# Patient Record
Sex: Female | Born: 1955 | Race: White | Hispanic: No | Marital: Married | Smoking: Never smoker
Health system: Southern US, Community
[De-identification: ages and names within clinical notes are randomized; demographics above are authoritative.]

## PROBLEM LIST (undated history)

## (undated) DIAGNOSIS — N946 Dysmenorrhea, unspecified: Secondary | ICD-10-CM

## (undated) DIAGNOSIS — N83201 Unspecified ovarian cyst, right side: Secondary | ICD-10-CM

## (undated) DIAGNOSIS — Z8742 Personal history of other diseases of the female genital tract: Secondary | ICD-10-CM

## (undated) DIAGNOSIS — N83202 Unspecified ovarian cyst, left side: Secondary | ICD-10-CM

## (undated) HISTORY — DX: Unspecified ovarian cyst, right side: N83.201

## (undated) HISTORY — PX: TONSILLECTOMY: SHX5217

## (undated) HISTORY — DX: Unspecified ovarian cyst, left side: N83.202

## (undated) HISTORY — PX: DILATION AND CURETTAGE OF UTERUS: SHX78

## (undated) HISTORY — DX: Personal history of other diseases of the female genital tract: Z87.42

## (undated) HISTORY — DX: Dysmenorrhea, unspecified: N94.6

## (undated) HISTORY — PX: WISDOM TOOTH EXTRACTION: SHX21

---

## 2000-09-09 ENCOUNTER — Ambulatory Visit (HOSPITAL_COMMUNITY): Admission: RE | Admit: 2000-09-09 | Discharge: 2000-09-09 | Payer: Self-pay | Admitting: Obstetrics and Gynecology

## 2000-09-09 ENCOUNTER — Encounter: Payer: Self-pay | Admitting: Obstetrics and Gynecology

## 2001-07-28 ENCOUNTER — Ambulatory Visit (HOSPITAL_COMMUNITY): Admission: RE | Admit: 2001-07-28 | Discharge: 2001-07-28 | Payer: Self-pay | Admitting: Obstetrics and Gynecology

## 2001-07-28 ENCOUNTER — Encounter (INDEPENDENT_AMBULATORY_CARE_PROVIDER_SITE_OTHER): Payer: Self-pay | Admitting: *Deleted

## 2001-07-28 ENCOUNTER — Encounter: Payer: Self-pay | Admitting: Obstetrics and Gynecology

## 2001-07-28 ENCOUNTER — Encounter (INDEPENDENT_AMBULATORY_CARE_PROVIDER_SITE_OTHER): Payer: Self-pay

## 2002-06-10 ENCOUNTER — Other Ambulatory Visit: Admission: RE | Admit: 2002-06-10 | Discharge: 2002-06-10 | Payer: Self-pay | Admitting: Obstetrics and Gynecology

## 2003-06-22 ENCOUNTER — Other Ambulatory Visit: Admission: RE | Admit: 2003-06-22 | Discharge: 2003-06-22 | Payer: Self-pay | Admitting: Obstetrics and Gynecology

## 2009-10-19 ENCOUNTER — Encounter: Admission: RE | Admit: 2009-10-19 | Discharge: 2009-10-19 | Payer: Self-pay | Admitting: Family Medicine

## 2010-07-06 NOTE — Op Note (Signed)
[facility]  Patient:    Maria Sanford, Maria Sanford Visit Number: [id number] MRN: [id number]          Service Type: DSU Location: [facility] Attending Physician:  Shaune Spittle Dictated by:   Maris Berger. Pennie Rushing, M.D. Proc. Date: 07/28/01 Admit Date:  07/28/2001 Discharge Date: 07/28/2001                             Operative Report  DATE OF BIRTH:                Aug 09, 1955  PREOPERATIVE DIAGNOSES:       1. Menorrhagia.                               2. Right ovarian solid lesion on ultrasound                                  persistent over one year.  POSTOPERATIVE DIAGNOSES:      1. Menorrhagia.                               2. Right ovarian complex cystic and solid                                  lesion.  OPERATION:                    1. Suction curettage.                               2. Cryoablation of the endometrium.                               3. Operative laparoscopy.                               4. Right ovarian lesion removal  SURGEON:                      Vanessa P. Pennie Rushing, M.D.  FIRST ASSISTANT:              Henreitta Leber, P.A.-C.  ANESTHESIA:                   General orotracheal.  ESTIMATED BLOOD LOSS:         Less than 50 cc.  COMPLICATIONS:                None.  FINDINGS:                     The uterus was normal size and sounded to 7 cm. Minimal tissue was obtained at the time of suction curettage prior to cryoablation.  The right ovary contained a 2 x 3 cm mass which was both cystic and solid in nature.  The left ovary contained an apparent corpus luteum cyst. There were in excrescences, and no other peritoneal lesions were noted.  DESCRIPTION OF PROCEDURE:     The patient was taken to  the operating room after appropriate identification and placed on the operating table.  She was placed in the supine position.  After the attainment of adequate general anesthesia, she was placed in the modified lithotomy  position.  The perineum and vagina were prepped with multiple layers of Betadine.  A Foley catheter was inserted into the bladder but was clamped for the cryoablation procedure. A single-tooth tenaculum was placed on the anterior cervix.  The uterus was sounded to 7 cm, and a 7 mm curet was used to suction curet the uterus after dilating the cervix to accommodate this curet.  Minimal tissue was obtained, and her option cryoprobe which had been prepared was used first to access the right cornual region for a 6-minute freeze.  Following the directions of her option software, the probe was removed and then directed again under ultrasound to the left cornual region for a 6-minute freeze.  The entire procedure was done under ultrasound guidance with formation of an excellent ice ball documented on ultrasound.  Once the 6-minute freeze on the left side was completed, the cryoprobe was removed once adequate thawing had occurred and the Foley catheter allowed to drain freely.  The abdomen was then prepped with multiple layers of Betadine and draped in a sterile field.  A subumbilical injection and suprapubic injection of 0.25% Marcaine was undertaken to anesthetize the areas anticipated for subsequent incision.  A subumbilical incision was then made and Veress cannula placed through that incision into the peritoneal cavity.   A pneumoperitoneum was created with 2.5 liters of CO2 and the Veress cannula removed.  The laparoscopic trocar was placed through the subumbilical incision and the laparoscope placed through the trocar sleeve.  Suprapubic incisions were made to the right and left of midline at the previously injected sites and laparoscopic probe trocars placed through those incisions into the peritoneal cavity under direct visualization.  The above-noted findings were made.  The right ovary was then grasped at the utero-ovarian ligament and elevated.  The cortex was incised, and a combination  of hydrodissection and sharp dissection allowed the cystic and solid lesion to be dissected off the surrounding cortex.  The lesion was then excised and placed in an Endocatch bag for removal through the subumbilical incision.  It was required that the subumbilical incision be widened with a Tresa Endo in order to remove the Endocatch bag, and a 5 mm scope was used to assist with the retrieval.  Once the Endocatch bag had been removed, the right ovarian cortex was visualized and the edges cauterized with Kleppinger forceps to allow adequate hemostasis of the right ovarian cortex.  Copious irrigation with lactated Ringers was carried out, and approximately 100 cc of warm lactated Ringers was left in the pelvis.  Hemostasis was noted to be adequate, and all instruments were removed from the peritoneal cavity under direct visualization as the CO2 was allowed to escape.  The subumbilical incision was closed first with fascial sutures of 0 Vicryl in an interrupted fashion and then with a subcuticular suture of 4-0 Vicryl; 4-0 Vicryl was used to close the suprapubic incisions in a subcuticular fashion and sterile dressings applied.  The single-tooth tenaculum and Foley catheter were removed from the cervix and bladder, respectively.  The patient was awakened from general anesthesia and taken to the recovery room in satisfactory condition having tolerated the procedure well with sponge and instrument counts correct. Dictated by:   Maris Berger. Pennie Rushing, M.D. Attending Physician:  Shaune Spittle DD:  07/28/01  TD:  07/30/01 Job: [id number]

## 2011-04-26 ENCOUNTER — Encounter: Payer: Self-pay | Admitting: Obstetrics and Gynecology

## 2011-05-27 ENCOUNTER — Ambulatory Visit: Payer: Self-pay | Admitting: Obstetrics and Gynecology

## 2011-07-22 ENCOUNTER — Telehealth: Payer: Self-pay

## 2011-07-22 DIAGNOSIS — R001 Bradycardia, unspecified: Secondary | ICD-10-CM | POA: Insufficient documentation

## 2011-07-22 DIAGNOSIS — N920 Excessive and frequent menstruation with regular cycle: Secondary | ICD-10-CM | POA: Insufficient documentation

## 2011-07-22 DIAGNOSIS — N838 Other noninflammatory disorders of ovary, fallopian tube and broad ligament: Secondary | ICD-10-CM

## 2011-07-22 DIAGNOSIS — D649 Anemia, unspecified: Secondary | ICD-10-CM

## 2011-07-22 DIAGNOSIS — O43199 Other malformation of placenta, unspecified trimester: Secondary | ICD-10-CM

## 2011-07-22 DIAGNOSIS — Q999 Chromosomal abnormality, unspecified: Secondary | ICD-10-CM | POA: Insufficient documentation

## 2011-07-22 DIAGNOSIS — N915 Oligomenorrhea, unspecified: Secondary | ICD-10-CM | POA: Insufficient documentation

## 2011-07-25 ENCOUNTER — Ambulatory Visit
Admission: RE | Admit: 2011-07-25 | Discharge: 2011-07-25 | Disposition: A | Discharge status: Home / Self Care | Payer: Managed Care, Other (non HMO) | Source: Ambulatory Visit | Attending: Obstetrics and Gynecology | Admitting: Obstetrics and Gynecology

## 2011-07-25 ENCOUNTER — Encounter: Payer: Self-pay | Admitting: Obstetrics and Gynecology

## 2011-07-25 ENCOUNTER — Ambulatory Visit (INDEPENDENT_AMBULATORY_CARE_PROVIDER_SITE_OTHER): Payer: Managed Care, Other (non HMO) | Admitting: Obstetrics and Gynecology

## 2011-07-25 VITALS — BP 118/80 | Resp 18 | Ht 60.0 in | Wt 117.0 lb

## 2011-07-25 DIAGNOSIS — R6889 Other general symptoms and signs: Secondary | ICD-10-CM

## 2011-07-25 DIAGNOSIS — Z01419 Encounter for gynecological examination (general) (routine) without abnormal findings: Secondary | ICD-10-CM

## 2011-07-25 DIAGNOSIS — Z124 Encounter for screening for malignant neoplasm of cervix: Secondary | ICD-10-CM

## 2011-07-25 DIAGNOSIS — E041 Nontoxic single thyroid nodule: Secondary | ICD-10-CM

## 2011-07-25 DIAGNOSIS — T679XXA Effect of heat and light, unspecified, initial encounter: Secondary | ICD-10-CM

## 2011-07-25 DIAGNOSIS — Z139 Encounter for screening, unspecified: Secondary | ICD-10-CM

## 2011-07-25 LAB — CBC
MCH: 31.6 pg (ref 26.0–34.0)
MCHC: 33.8 g/dL (ref 30.0–36.0)
MCV: 93.3 fL (ref 78.0–100.0)
Platelets: 264 10*3/uL (ref 150–400)
RDW: 13.6 % (ref 11.5–15.5)

## 2011-07-25 LAB — TSH: TSH: 2.721 u[IU]/mL (ref 0.350–4.500)

## 2011-07-25 NOTE — Progress Notes (Signed)
Contraception PM Last pap 2012 Last Mammo 03/2010 Last Colonoscopy 2011 Last Dexa Scan never Primary MD [facility]/ [facility] Abuse at Home none  No complaints  Filed Vitals:   07/25/11 0958  BP: 118/80  Resp: 18    ROS: noncontributory  Physical Examination: General appearance - alert, well appearing, and in no distress Neck - supple, no significant adenopathy, thyroid nodular on right Chest - clear to auscultation, no wheezes, rales or rhonchi, symmetric air entry Heart - normal rate and regular rhythm Abdomen - soft, nontender, nondistended, no masses or organomegaly Breasts - breasts appear normal, no suspicious masses, no skin or nipple changes or axillary nodes Pelvic - normal external genitalia, vulva, vagina, cervix, uterus and adnexa, atrophic vagina Back exam - no CVAT Extremities - no edema, redness or tenderness in the calves or thighs  A/P Pap Labs Thyroid u/s

## 2011-07-29 LAB — PAP IG W/ RFLX HPV ASCU

## 2011-07-31 ENCOUNTER — Telehealth: Payer: Self-pay | Admitting: Obstetrics and Gynecology

## 2011-07-31 ENCOUNTER — Other Ambulatory Visit: Payer: Self-pay | Admitting: Obstetrics and Gynecology

## 2011-07-31 DIAGNOSIS — E041 Nontoxic single thyroid nodule: Secondary | ICD-10-CM

## 2011-07-31 NOTE — Telephone Encounter (Signed)
Message copied by Mason Jim on Wed Jul 31, 2011  2:26 PM ------      Message from: Osborn Coho      Created: Thu Jul 25, 2011 10:28 PM       Please inform pt and refer to ENT for Biopsy of thyroid nodule and send report.  Please refer to Dr. Talmage Nap to follow as well.  Labs pending.  Thanks

## 2011-07-31 NOTE — Telephone Encounter (Signed)
Pt called about u/s results. Dr Su Hilt has a note on the u/s report for referral.

## 2011-07-31 NOTE — Telephone Encounter (Signed)
Per Dr Alinda Sierras, pt scheduled with Dr Pollyann Kennedy at [facility] 08/06/11 at 9:30.  To arrive 15 min early with list of meds and copay. Referral faxed to Dr Talmage Nap.   TC to pt. LM to return call.

## 2011-07-31 NOTE — Telephone Encounter (Signed)
JACKIE/ar PT

## 2011-07-31 NOTE — Telephone Encounter (Signed)
TC to pt.  Informed of appt with DR Pollyann Kennedy.  To calll if does not hear from DR Balan"s office in 1 week.  Pt verbalizes comprehension.

## 2011-08-01 ENCOUNTER — Telehealth: Payer: Self-pay

## 2011-08-01 NOTE — Telephone Encounter (Signed)
Called pt to answer her ? About when thyroid nodule biopy will be done. Per Nurse at Dr. Lucky Rathke office, usually bx can be done on the initial appt. Date. Pt states she has appt w/ Dr. Talmage Nap on Monday the 17th June and then sees Dr Pollyann Kennedy the 18th , the next day.  Melody Comas A

## 2011-08-06 ENCOUNTER — Other Ambulatory Visit: Payer: Self-pay | Admitting: Endocrinology

## 2011-08-06 DIAGNOSIS — E041 Nontoxic single thyroid nodule: Secondary | ICD-10-CM

## 2011-08-07 ENCOUNTER — Telehealth: Payer: Self-pay | Admitting: Obstetrics and Gynecology

## 2011-08-07 NOTE — Telephone Encounter (Signed)
TC to pt.   States was seen by Dr Talmage Nap 08/05/11.  States Dr Talmage Nap will do further F/U .  Cancelled appt with Dr Pollyann Kennedy.

## 2011-08-20 ENCOUNTER — Ambulatory Visit
Admission: RE | Admit: 2011-08-20 | Discharge: 2011-08-20 | Disposition: A | Discharge status: Home / Self Care | Payer: Managed Care, Other (non HMO) | Source: Ambulatory Visit | Attending: Endocrinology | Admitting: Endocrinology

## 2011-08-20 ENCOUNTER — Other Ambulatory Visit (HOSPITAL_COMMUNITY)
Admission: RE | Admit: 2011-08-20 | Discharge: 2011-08-20 | Disposition: A | Discharge status: Home / Self Care | Payer: Managed Care, Other (non HMO) | Source: Ambulatory Visit | Attending: Interventional Radiology | Admitting: Interventional Radiology

## 2011-08-20 DIAGNOSIS — E041 Nontoxic single thyroid nodule: Secondary | ICD-10-CM

## 2012-11-24 ENCOUNTER — Other Ambulatory Visit: Payer: Self-pay | Admitting: Endocrinology

## 2012-11-24 DIAGNOSIS — E041 Nontoxic single thyroid nodule: Secondary | ICD-10-CM

## 2012-12-03 ENCOUNTER — Other Ambulatory Visit: Payer: Managed Care, Other (non HMO)

## 2013-03-17 ENCOUNTER — Ambulatory Visit (INDEPENDENT_AMBULATORY_CARE_PROVIDER_SITE_OTHER): Payer: 59 | Admitting: Psychology

## 2013-03-17 DIAGNOSIS — F908 Attention-deficit hyperactivity disorder, other type: Secondary | ICD-10-CM

## 2013-03-17 DIAGNOSIS — F909 Attention-deficit hyperactivity disorder, unspecified type: Secondary | ICD-10-CM

## 2013-03-17 DIAGNOSIS — F3110 Bipolar disorder, current episode manic without psychotic features, unspecified: Secondary | ICD-10-CM

## 2013-03-26 ENCOUNTER — Other Ambulatory Visit: Payer: Managed Care, Other (non HMO)

## 2013-03-26 ENCOUNTER — Ambulatory Visit (HOSPITAL_COMMUNITY): Payer: Managed Care, Other (non HMO) | Admitting: Psychology

## 2013-03-31 ENCOUNTER — Ambulatory Visit
Admission: RE | Admit: 2013-03-31 | Discharge: 2013-03-31 | Disposition: A | Discharge status: Home / Self Care | Payer: 59 | Source: Ambulatory Visit | Attending: Endocrinology | Admitting: Endocrinology

## 2013-03-31 DIAGNOSIS — E041 Nontoxic single thyroid nodule: Secondary | ICD-10-CM

## 2013-04-05 ENCOUNTER — Other Ambulatory Visit: Payer: Self-pay | Admitting: Endocrinology

## 2013-04-05 DIAGNOSIS — E041 Nontoxic single thyroid nodule: Secondary | ICD-10-CM

## 2013-04-16 ENCOUNTER — Ambulatory Visit (HOSPITAL_COMMUNITY): Payer: Managed Care, Other (non HMO) | Admitting: Psychology

## 2013-05-10 ENCOUNTER — Ambulatory Visit (HOSPITAL_COMMUNITY): Payer: Managed Care, Other (non HMO) | Admitting: Psychology

## 2013-05-14 NOTE — Progress Notes (Signed)
Patient:   Maria Sanford   DOB:   [birth year]  MR Number:  [id number]  Location:  [facility] [address] [place] [id number] Dept: [phone]           Date of Service:   03/17/2013  Start Time:   11 AM End Time:   12 PM  Provider/Observer:  Hershal CoriaJohn R Rodenbough PSYD       Billing Code/Service: [phone]  Chief Complaint:     Chief Complaint  Patient presents with  . ADD    Reason for Service:  The patient was self referred because of significant concerns about her lack of focus and difficulties with attention and concentration. The patient reports that she has had great difficulty completing tasks and staying on track. The patient reports that her father had attention deficit disorder and she has a question about the possibility of whether she has a dull residual attention deficit disorder as well. There is a positive family history of mental illness. Her mother has been diagnosed with a: Disorder and her father was diagnosed with ADD and is always been very hyper. The patient has been treated for mania and but gets depressed on rare occasions. The patient also has times of poor sleep. The patient has also been treated for thyroid disorder as well.  Current Status:  The patient reports a lifelong history of problems with hyperactivity and attentional problems but does acknowledge some episodes of hypomanic or manic behavior and some rare occasions of depression.  Reliability of Information: Information is provided by the patient as well as review of available medical records.  Behavioral Observation: Maria Sanford  presents as a 58 y.o.-year-old Right Caucasian Female who appeared her stated age. her dress was Appropriate and she was Well Groomed and her manners were Appropriate to the situation.  There were not any physical disabilities noted.  she displayed an appropriate level of  cooperation and motivation.    Interactions:    Active   Attention:   While there were no overt problems identified with attention and concentration during the clinical interview the patient did acknowledge that this is been a major issue for her.  Memory:   The patient reports problems with memory but not to a major degree. This likely is more to do with attention/concentration and focus difficulties.  Visuo-spatial:   within normal limits  Speech (Volume):  normal  Speech:   normal pitch  Thought Process:  Coherent  Though Content:  WNL  Orientation:   person, place, time/date and situation  Judgment:   Good  Planning:   Good  Affect:    The patient's affect was appropriate to the situation no overt symptoms of anxiety or depression.  Mood:    no overt symptoms of anxiety or depression.  Insight:   Good  Intelligence:   high  Marital Status/Living: The patient was [place]. Both her parents are deceased. The patient is married and has a 58 year old son and a 58 year old daughter. She currently lives with her husband. The patient is a 58 year old sister and a 58 year old brother. The patient spends his leisure time gardening, reading, and riding motorcycles.   Substance Use:  No concerns of substance abuse are reported.      Medical History:   Past Medical History  Diagnosis Date  . Hx of menorrhagia   . Ovarian cyst, bilateral   .  Dysmenorrhea         Outpatient Encounter Prescriptions as of 03/17/2013  Medication Sig  . divalproex (DEPAKOTE) 125 MG DR tablet Take 125 mg by mouth 3 (three) times daily.  Marland Kitchen lamoTRIgine (LAMICTAL) 150 MG tablet Take 150 mg by mouth daily.          Sexual History:   History  Sexual Activity  . Sexual Activity: Not on file    Abuse/Trauma History: The patient does not report any history of abuse/trauma.  Psychiatric History:  The patient does report that she has been treated for  bipolar disorder like symptoms and has been taking Depakote and Lamictal.  Family Med/Psych History: No family history on file.  Risk of Suicide/Violence: The patient denies any suicidal or homicidal ideation.   Impression/DX:  the patient reports that her mother has a significant bipolar affective disorder in her father is long displayed issues of hypokinesis problems. The patient has been treated for manic episodes with Depakote and Lamictal. The current differential has to do with bipolar manic type, chronic sleep deprivation versus a dull residual attention deficit disorder.  Disposition/Plan:  We will set up patient for formal testing utilizing the comprehensive attention battery and the cab cpt measure.  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

## 2013-05-17 ENCOUNTER — Ambulatory Visit (INDEPENDENT_AMBULATORY_CARE_PROVIDER_SITE_OTHER): Payer: 59 | Admitting: Psychology

## 2013-05-17 DIAGNOSIS — F908 Attention-deficit hyperactivity disorder, other type: Secondary | ICD-10-CM

## 2013-05-17 DIAGNOSIS — F909 Attention-deficit hyperactivity disorder, unspecified type: Secondary | ICD-10-CM

## 2013-05-17 DIAGNOSIS — F3110 Bipolar disorder, current episode manic without psychotic features, unspecified: Secondary | ICD-10-CM

## 2013-05-19 ENCOUNTER — Encounter (HOSPITAL_COMMUNITY): Payer: Self-pay | Admitting: Psychology

## 2013-05-19 NOTE — Progress Notes (Signed)
The patient was administered the Comprehensive Attention Battery and the CAB CPT measures. The patient appeared to fully participate in these testing procedures and this does appear to be a fair and valid sample of her current attentional abilities as well as various aspects of executive functioning. Below are the results of this broad and comprehensive assessment of attention/concentration and executive functioning.  Initially, the patient was administered the auditory/visual reaction time test. These two measures are both pure reaction time measures and are administered in both the visual and auditory modalities. On the visual pure reaction time test, the patient accurately responded to 50 of the 50 targets, which is within normal limits. her average response time was 313 ms which is also within normal limits. The patient was administered the auditory pure reaction time test and she correctly responded to 50 of 50 targets, which is an efficient performance and within normal limits. her average response time was 326 ms, which also within normal limits.  The patient was then administered the discriminant reaction time test. she was administered the visual, auditory, and mixed subtests. On the visual discriminate reaction time measure, she correctly responded to 34 of 35 targets and had 0 errors of commission and 1 errors of omission. This is an efficient performance and represents a performance that is well within normative expectations. her average response time for correctly responded to items was 356 ms which is also within normal limits. The patient was then administered the auditory discriminate reaction time measure. she correctly responded to 34 of 35 targets, which is efficient and within normal limits. her average response time was 608 ms, which is within normative expectations. The patient was then administered the mixed discriminate reaction time, which require shifting from between either auditory  or visual targets with an alteration between auditory and visual stimuli. This measure require shifting attention on top of discriminate identification and responding.  The patient correctly responded to 24 of the 30 targets and had to errors of commission and 6 errors of omission. This is a more impaired score for accuracy nearly one standard deviation above normative levels.  her average response time for correct responses was 698 ms.  This performance is within  normal limits and represents good response times for correctly identified items. However, the patient clearly had difficulty shifting between targets and had evidence of lapses of attention during this measure. Lapses of attention and cognitive shifting were noted.  The patient was administered the auditory/visual scan reaction time test. On the visual measure the patient correctly responded to 35 of 40 targets. She had 0 errors of commission and 5 errors of omission and the average response time was within normal limits. The auditory measure resulted in the correct response to 35 of 40 targets with 1 errors of commission and 5 error of omission. her average response times within normal limits. The patient was then administered the mixed auditory visual scan measure and she correctly responded to 32 of 40 targets, which is significantly impaired and more than one half standard deviations below normative expectations. Her average response times were within normal limits.  The patient was then administered the auditory/visual encoding test. On the auditory forwards the patient's performance was within normal limits.  On the auditory backwards measures the patient's performance was within normal limits.  This pattern suggests good performance with regard to auditory encoding. On the visual encoding forward measure the patient produced performance that was within normal limits.  On the visual backwards measures the  patient's performance was within normal  limits.  Overall, this pattern suggests that auditory encoding is efficient and within normal limits which was also the finding for her visual encoding abilities.  The patient was then administered the Stroop interference cancellation test. This task is broken down into eight separate trials. On the first four trials the patient is presented with a focus execute task that requires the patient to scan a 36 grid layout in which the words red green or blue were randomly printed in each grid. Each of these color words and be printed in either red green or blue color. On half of them, the word matches the color of the font and it is these that the patient is to identify where the color and word match. After the first four trials of this visual scanning measure change to four trials that include a Stroop interference component inwhich the words red green and blue are played randomly over the speakers. On the first four "noninterference" trials the patient produced performances on these focus execute task that were moderately impaired and between 1-1/2 standard deviations below normal limits. she correctly identified between 7 and 10 items on each of these trials. On the next four interference trials, the patient's performance showed significant deterioration especially with the first interference trial. She only got 3 of the 18th target which is 1-1/2 standard deviations below normative expectations. Her performance did not improve significantly she continued to show deficits with regard to visual scanning and processing of this more complex information. Because earlier testing show that her information processing is generally well within normal limits it is likely that these visual distractors and auditory distractors were the primary factor for her weakness in this area.  The patient was then administered the CAB CPT visual monitor measure, which is a 15 minute long visual continuous performance measure.  This  measure is broken down into five 3-minute blocks of time for analysis. The patient is presented with either the color red green or blue every 2 seconds and every time the color red is presented the patient is to respond. On the first 3 min. Block of time the patient correctly identified 28 of 30 targets with 1 error of commission and 2 errors of omission. her average response time was 452 ms. This performance showed significant deterioration over the next four blocks of time.  Average response time showed a great deal of variability with significant increases in response time is a function of time. She clearly deteriorated as far as her response time by more than 150-200 ms slowing or active responded to items. A number of errors of omission during each of two 3 minute blocks of time be. However, between the 6-15 minute mark she had a total of 22 errors of omission. She showed a marked deterioration as a function of time with significant increases in errors of omission. In fact, she was missing target items as much is 30% of the time or to the end of the measure.  This pattern of performance on a continuous performance test was significantly impaired and clearly indicative of severe and significant problems with sustained attention and concentration. This is in marked contrast to an excellent ability to do these discriminate reaction time measures for brief periods of time of up 2 or 3 minutes.  Overall:  The patient's performance on this broad range of attention/concentration measures and executive functioning measures are clearly indicative of significant deficits with regard to attention and concentration. These deficits  go well beyond those typically seen with patterns associated with bipolar affective disorder as the primary etiological factor. The patient showed a number of areas of significant impairment. One has to do with significant problems in shifting attention between different stimuli. She was  easily distracted and had difficulty remembering what target she was instructed to attend to. The patient showed difficulty with even simple switching between visual and auditory stimuli and produced a significant number of errors of omission. On targeted interference measures, the patient showed difficulty to both sustain attention as she did better in the initial noninterference trials the deteriorated over these noninterference trials. We typically see in improvement with each of the 4 noninterference trials over time.   She also showed a marked impairment when there was a target interference presented to her. She was overly distracted  and confused by this interference. Also, the patient showed significant  problems with sustained attention. She showed a marked deterioration of attention and concentration over time with significant increases in errors of omission as well as significant reduction in response times to items that she correctly identified.   The patient showed excellent auditory and visual encoding abilities,  suggesting a good learning and ability  as these items were not done in any sustained manner and there was no time limit or distractor present.  Overall, the patient shows marked deficits with regard to shifting attention, sustained attention, and distractibility. This pattern as well beyond those typically seen with bipolar disorder and it does appear that she clearly show symptoms consistent with  adult residual attention deficit disorder. In fact, some of her hyperkinesis may be related to this condition rather than hypomanic or manic episodes. I will provided feedback regarding the results of the current neuropsychological testing to the patient.

## 2013-06-09 ENCOUNTER — Telehealth (HOSPITAL_COMMUNITY): Payer: Self-pay | Admitting: *Deleted

## 2013-06-10 ENCOUNTER — Ambulatory Visit (HOSPITAL_COMMUNITY): Payer: Self-pay | Admitting: Psychology

## 2013-06-24 ENCOUNTER — Ambulatory Visit (INDEPENDENT_AMBULATORY_CARE_PROVIDER_SITE_OTHER): Payer: 59 | Admitting: Psychology

## 2013-06-24 DIAGNOSIS — F909 Attention-deficit hyperactivity disorder, unspecified type: Secondary | ICD-10-CM

## 2013-06-24 DIAGNOSIS — F908 Attention-deficit hyperactivity disorder, other type: Secondary | ICD-10-CM

## 2013-06-24 NOTE — Progress Notes (Signed)
   PROGRESS NOTE  Provided feedback of neuropsych testing.  Adult residual attention deficit disorder.  Overall:  The patient's performance on this broad range of attention/concentration measures and executive functioning measures are clearly indicative of significant deficits with regard to attention and concentration. These deficits go well beyond those typically seen with patterns associated with bipolar affective disorder as the primary etiological factor. The patient showed a number of areas of significant impairment. One has to do with significant problems in shifting attention between different stimuli. She was easily distracted and had difficulty remembering what target she was instructed to attend to. The patient showed difficulty with even simple switching between visual and auditory stimuli and produced a significant number of errors of omission. On targeted interference measures, the patient showed difficulty to both sustain attention as she did better in the initial noninterference trials the deteriorated over these noninterference trials. We typically see in improvement with each of the 4 noninterference trials over time. She also showed a marked impairment when there was a target interference presented to her. She was overly distracted and confused by this interference. Also, the patient showed significant problems with sustained attention. She showed a marked deterioration of attention and concentration over time with significant increases in errors of omission as well as significant reduction in response times to items that she correctly identified. The patient showed excellent auditory and visual encoding abilities, suggesting a good learning and ability as these items were not done in any sustained manner and there was no time limit or distractor present.  Overall, the patient shows marked deficits with regard to shifting attention, sustained attention, and distractibility. This pattern as  well beyond those typically seen with bipolar disorder and it does appear that she clearly show symptoms consistent with adult residual attention deficit disorder. In fact, some of her hyperkinesis may be related to this condition rather than hypomanic or manic episodes. I will provided feedback regarding the results of the current neuropsychological testing to the patient.       Patient to go back to PCP with this info for his consideration of medication options.     Hershal CoriaODENBOUGH,JOHN R, PsyD 06/24/2013

## 2013-07-06 ENCOUNTER — Ambulatory Visit (HOSPITAL_COMMUNITY): Payer: Self-pay | Admitting: Psychology

## 2013-12-02 IMAGING — US US SOFT TISSUE HEAD/NECK
1 series · 14 of 25 positions shown · non-contrast
Comparison: None.

CLINICAL DATA: Right thyroid nodule on physical exam

THYROID ULTRASOUND
TECHNIQUE: Ultrasound examination of the thyroid gland and adjacent
soft tissues was performed.

[Series 1: us soft tissue head/neck · 0.08mm/px · 14 of 55 slices shown]
[im 1/55]
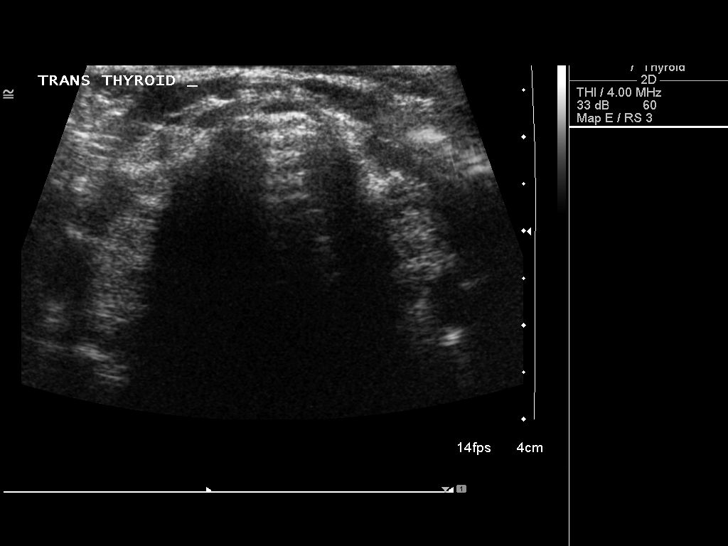
[im 5/55]
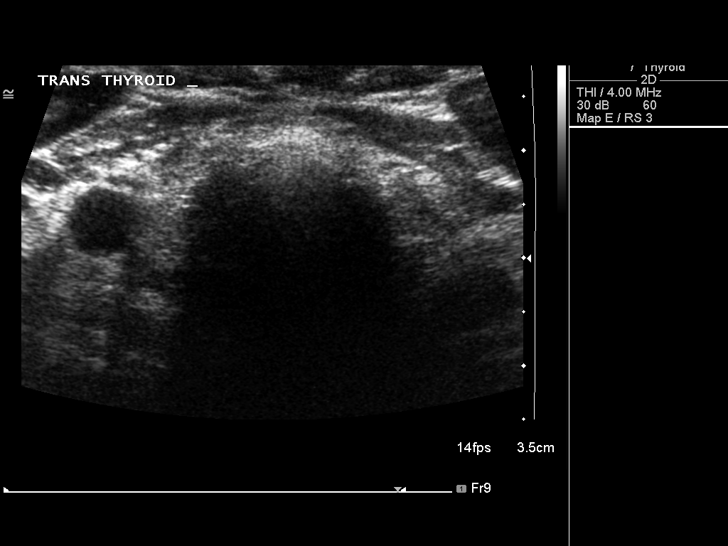
[im 10/55]
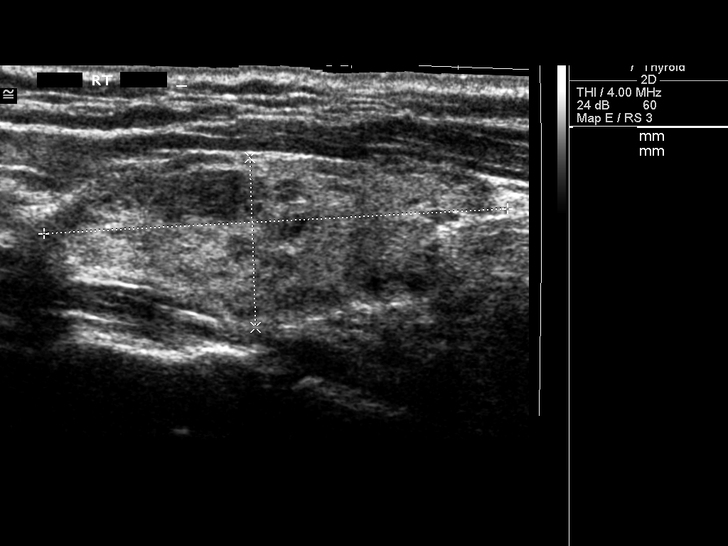
[im 14/55]
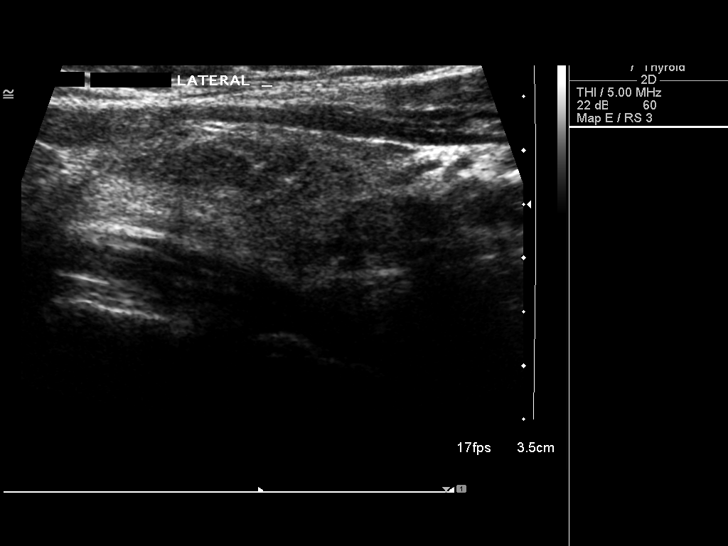
[im 19/55]
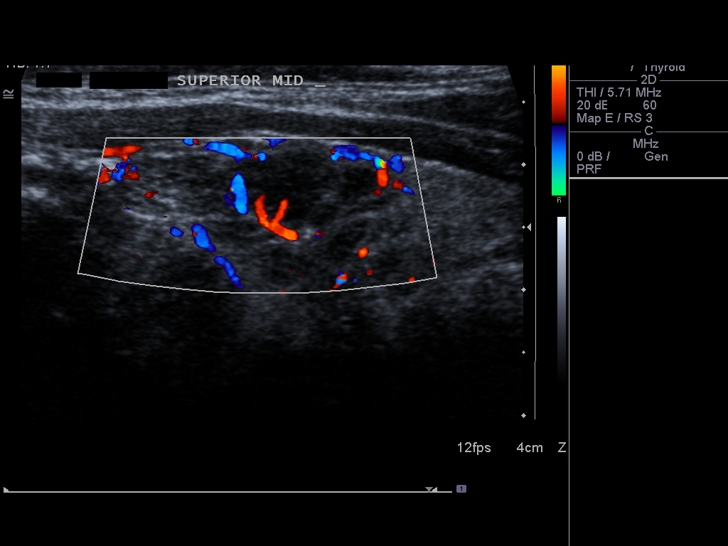
[im 21/55]
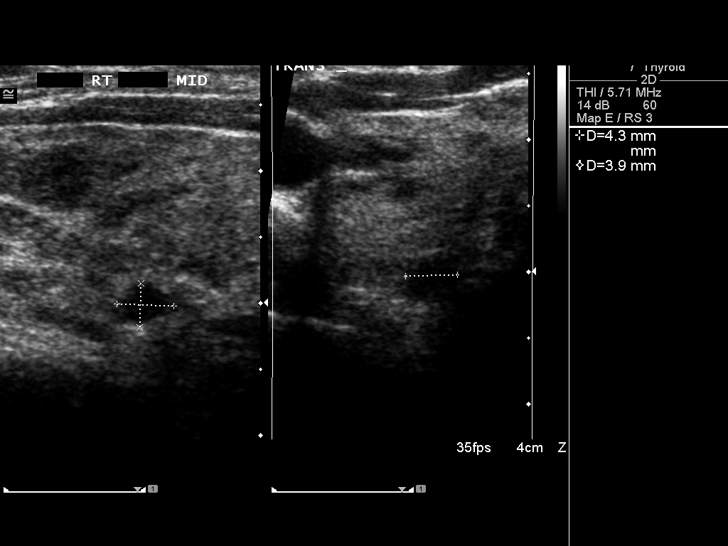
[im 25/55]
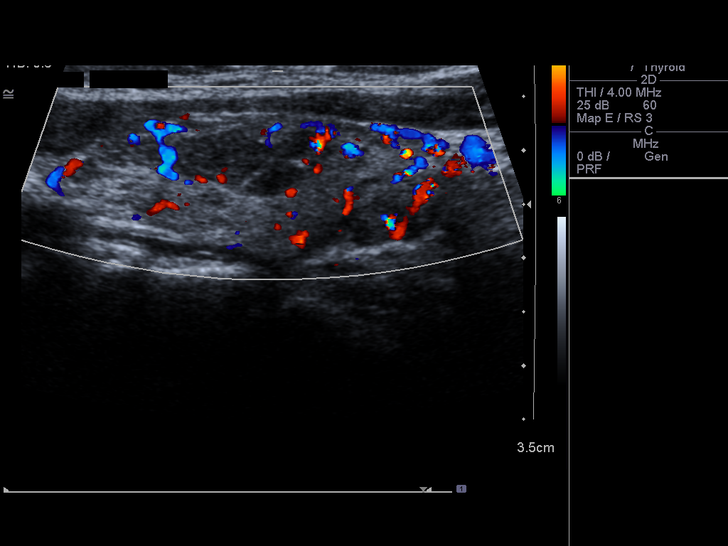
[im 30/55]
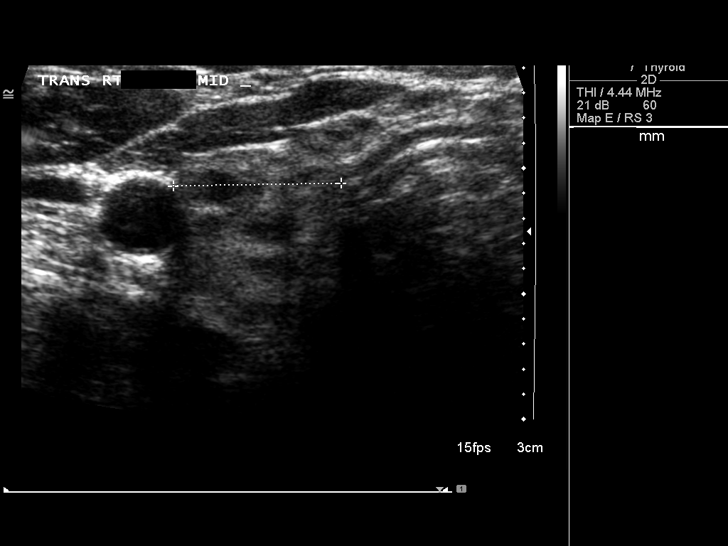
[im 34/55]
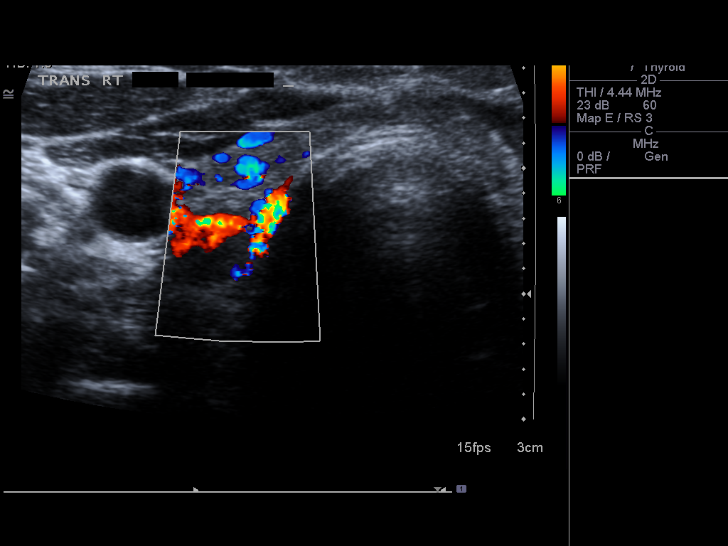
[im 37/55]
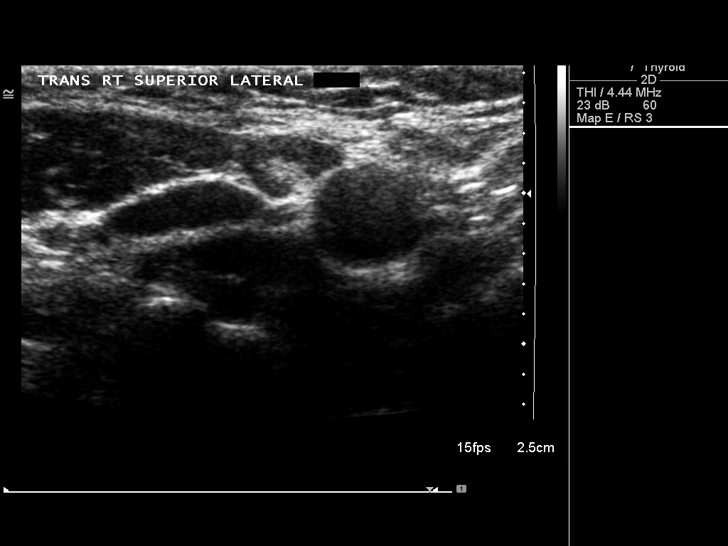
[im 41/55]
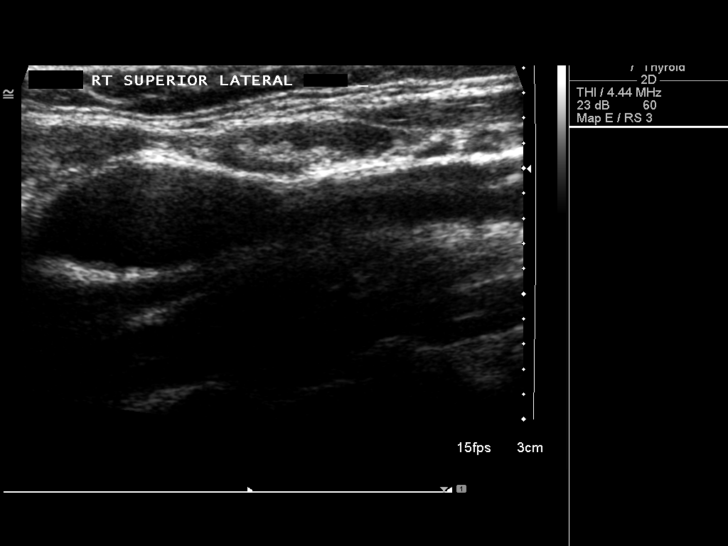
[im 46/55]
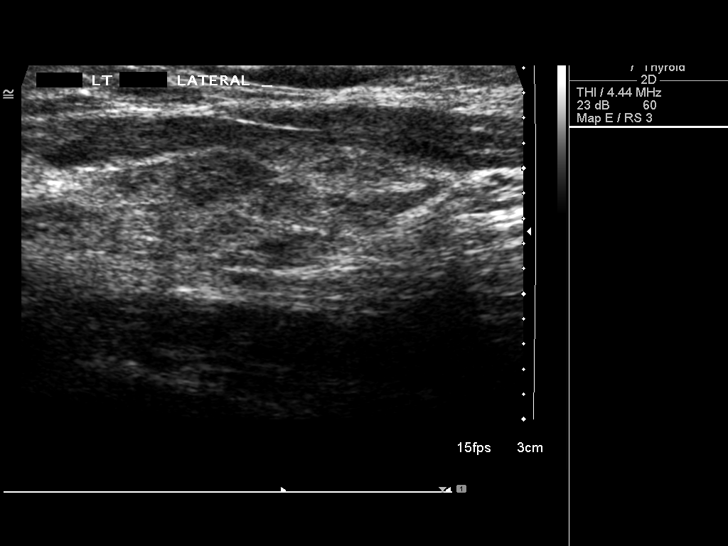
[im 50/55]
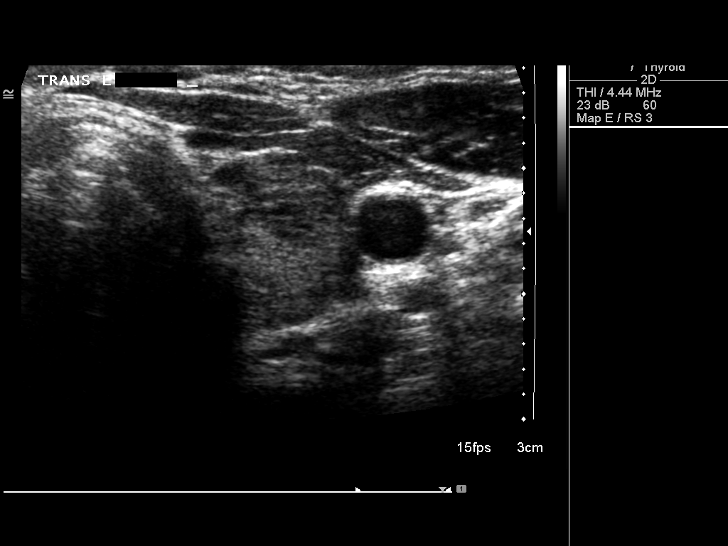
[im 55/55]
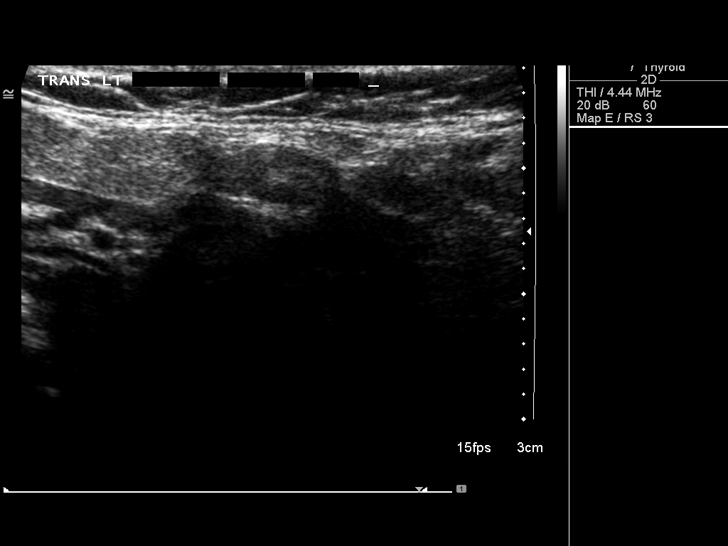

[14 of 25 positions shown; findings below may reference images not displayed]

FINDINGS: Right thyroid lobe:  4.4 x 1.6 x 1.3 cm.
Left thyroid lobe:  4.2 x 1.6 x 1.3 cm.
Isthmus:  2 mm in thickness.

Focal nodules:  The echogenicity of the thyroid gland is
inhomogeneous.  There is a poorly defined inhomogeneous nodule in
the upper pole of the right lobe measuring 2.2 x 1.0 x 1.1 cm.
Small nodules on the right measure no more than 9 mm in diameter.
No definite left nodule is seen.

Lymphadenopathy:  None visualized.
IMPRESSION: Right-sided thyroid nodules with the largest being indistinct
measuring 2.2 x 1.0 x 1.1 cm.  Findings meet consensus criteria for
biopsy.  Ultrasound-guided fine needle aspiration should be
considered, as per the consensus statement: Management of Thyroid
Nodules Detected at US:  [employer] in Ultrasound

## 2014-03-23 ENCOUNTER — Ambulatory Visit
Admission: RE | Admit: 2014-03-23 | Discharge: 2014-03-23 | Disposition: A | Discharge status: Home / Self Care | Payer: Managed Care, Other (non HMO) | Source: Ambulatory Visit | Attending: Endocrinology | Admitting: Endocrinology

## 2014-03-23 DIAGNOSIS — E041 Nontoxic single thyroid nodule: Secondary | ICD-10-CM

## 2016-02-15 ENCOUNTER — Other Ambulatory Visit: Payer: Self-pay | Admitting: Endocrinology

## 2016-02-15 DIAGNOSIS — E041 Nontoxic single thyroid nodule: Secondary | ICD-10-CM

## 2016-04-08 ENCOUNTER — Ambulatory Visit
Admission: RE | Admit: 2016-04-08 | Discharge: 2016-04-08 | Disposition: A | Discharge status: Home / Self Care | Payer: Managed Care, Other (non HMO) | Source: Ambulatory Visit | Attending: Endocrinology | Admitting: Endocrinology

## 2016-04-08 DIAGNOSIS — E041 Nontoxic single thyroid nodule: Secondary | ICD-10-CM

## 2017-03-04 ENCOUNTER — Other Ambulatory Visit: Payer: Self-pay | Admitting: Endocrinology

## 2017-03-04 DIAGNOSIS — E041 Nontoxic single thyroid nodule: Secondary | ICD-10-CM

## 2017-04-09 ENCOUNTER — Other Ambulatory Visit: Payer: Self-pay

## 2017-05-01 ENCOUNTER — Ambulatory Visit
Admission: RE | Admit: 2017-05-01 | Discharge: 2017-05-01 | Disposition: A | Discharge status: Home / Self Care | Payer: BLUE CROSS/BLUE SHIELD | Source: Ambulatory Visit | Attending: Endocrinology | Admitting: Endocrinology

## 2017-05-01 DIAGNOSIS — E041 Nontoxic single thyroid nodule: Secondary | ICD-10-CM

## 2018-07-28 ENCOUNTER — Other Ambulatory Visit: Payer: Self-pay | Admitting: Pediatric Intensive Care

## 2018-07-28 DIAGNOSIS — Z20822 Contact with and (suspected) exposure to covid-19: Secondary | ICD-10-CM

## 2018-07-30 LAB — NOVEL CORONAVIRUS, NAA: SARS-CoV-2, NAA: NOT DETECTED

## 2018-10-01 ENCOUNTER — Other Ambulatory Visit: Payer: Self-pay | Admitting: Obstetrics and Gynecology

## 2018-10-01 DIAGNOSIS — Z1231 Encounter for screening mammogram for malignant neoplasm of breast: Secondary | ICD-10-CM

## 2018-12-08 ENCOUNTER — Other Ambulatory Visit: Payer: Self-pay

## 2018-12-08 ENCOUNTER — Ambulatory Visit
Admission: RE | Admit: 2018-12-08 | Discharge: 2018-12-08 | Disposition: A | Discharge status: Home / Self Care | Payer: BC Managed Care – PPO | Source: Ambulatory Visit | Attending: Obstetrics and Gynecology | Admitting: Obstetrics and Gynecology

## 2018-12-08 DIAGNOSIS — Z1231 Encounter for screening mammogram for malignant neoplasm of breast: Secondary | ICD-10-CM

## 2019-02-05 ENCOUNTER — Other Ambulatory Visit: Payer: Self-pay | Admitting: Endocrinology

## 2019-02-05 DIAGNOSIS — E041 Nontoxic single thyroid nodule: Secondary | ICD-10-CM

## 2019-02-08 ENCOUNTER — Ambulatory Visit
Admission: RE | Admit: 2019-02-08 | Discharge: 2019-02-08 | Disposition: A | Discharge status: Home / Self Care | Payer: BC Managed Care – PPO | Source: Ambulatory Visit | Attending: Endocrinology | Admitting: Endocrinology

## 2019-02-08 DIAGNOSIS — E041 Nontoxic single thyroid nodule: Secondary | ICD-10-CM

## 2019-03-25 ENCOUNTER — Telehealth: Payer: Self-pay | Admitting: Nurse Practitioner

## 2019-03-26 ENCOUNTER — Telehealth: Payer: Self-pay | Admitting: Nurse Practitioner

## 2019-03-26 ENCOUNTER — Other Ambulatory Visit: Payer: Self-pay | Admitting: Nurse Practitioner

## 2019-03-26 DIAGNOSIS — R001 Bradycardia, unspecified: Secondary | ICD-10-CM

## 2019-03-26 DIAGNOSIS — U071 COVID-19: Secondary | ICD-10-CM

## 2019-03-26 NOTE — Progress Notes (Signed)
  I connected by phone with Maria Sanford on 03/26/2019 at 11:46 AM to discuss the potential use of an new treatment for mild to moderate COVID-19 viral infection in non-hospitalized patients.  This patient is a 64 y.o. female that meets the FDA criteria for Emergency Use Authorization of bamlanivimab or casirivimab\imdevimab.  Has a (+) direct SARS-CoV-2 viral test result  Has mild or moderate COVID-19   Is ? 64 years of age and weighs ? 40 kg  Is NOT hospitalized due to COVID-19  Is NOT requiring oxygen therapy or requiring an increase in baseline oxygen flow rate due to COVID-19  Is within 10 days of symptom onset  Has at least one of the high risk factor(s) for progression to severe COVID-19 and/or hospitalization as defined in EUA.  Specific high risk criteria : Cardiovascular Disease   I have spoken and communicated the following to the patient or parent/caregiver:  1. FDA has authorized the emergency use of bamlanivimab and casirivimab\imdevimab for the treatment of mild to moderate COVID-19 in adults and pediatric patients with positive results of direct SARS-CoV-2 viral testing who are 69 years of age and older weighing at least 40 kg, and who are at high risk for progressing to severe COVID-19 and/or hospitalization.  2. The significant known and potential risks and benefits of bamlanivimab and casirivimab\imdevimab, and the extent to which such potential risks and benefits are unknown.  3. Information on available alternative treatments and the risks and benefits of those alternatives, including clinical trials.  4. Patients treated with bamlanivimab and casirivimab\imdevimab should continue to self-isolate and use infection control measures (e.g., wear mask, isolate, social distance, avoid sharing personal items, clean and disinfect "high touch" surfaces, and frequent handwashing) according to CDC guidelines.   5. The patient or parent/caregiver has the option to  accept or refuse bamlanivimab or casirivimab\imdevimab .  After reviewing this information with the patient, The patient agreed to proceed with receiving the bamlanimivab infusion and will be provided a copy of the Fact sheet prior to receiving the infusion.   Nikki Pickenpack-Cousar 03/26/2019 11:46 AM

## 2019-03-26 NOTE — Telephone Encounter (Signed)
Called to discuss with Maria Sanford about Covid symptoms and the use of bamlanivimab, a monoclonal antibody infusion for those with mild to moderate Covid symptoms and at a high risk of hospitalization.     Pt is qualified for this infusion at the [facility] infusion center due to co-morbid conditions and/or a member of an at-risk group. Further discussions would be needed to confirm qualification.   Referral received via COVID infusion hotline from [facility] physicians.   Unable to reach patient. HIPPA appropriate voicemail left. Callback number to the infusion center given.  Patient Active Problem List   Diagnosis Date Noted  . h/o Bradycardia 07/22/2011    [name], AGPCNP-BC Pager: [phone] Amion: N. Cousar

## 2019-03-26 NOTE — Telephone Encounter (Signed)
Called to discuss with Maria Sanford about Covid symptoms and the use of bamlanivimab, a monoclonal antibody infusion for those with mild to moderate Covid symptoms and at a high risk of hospitalization.     Pt is qualified for this infusion at the [facility] infusion center due to co-morbid conditions and/or a member of an at-risk group.   Patient is symptomatic, has a hx of CAD, bradycardia. She received positive results from [facility] and is aware to bring results or have results faxed to the infusion center. Information given.   At patient's request she has been scheduled for infusion on 03/27/19 @ 1030 am. She verbalized understanding of appointment details.  Patient Active Problem List   Diagnosis Date Noted  . 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 Cousar

## 2019-03-27 ENCOUNTER — Ambulatory Visit (HOSPITAL_COMMUNITY)
Admission: RE | Admit: 2019-03-27 | Discharge: 2019-03-27 | Disposition: A | Discharge status: Home / Self Care | Payer: BC Managed Care – PPO | Source: Ambulatory Visit | Attending: Pulmonary Disease | Admitting: Pulmonary Disease

## 2019-03-27 DIAGNOSIS — R001 Bradycardia, unspecified: Secondary | ICD-10-CM | POA: Diagnosis present

## 2019-03-27 DIAGNOSIS — U071 COVID-19: Secondary | ICD-10-CM | POA: Diagnosis present

## 2019-03-27 MED ORDER — DIPHENHYDRAMINE HCL 50 MG/ML IJ SOLN: 50.0000 mg | Freq: Once | INTRAMUSCULAR | Status: DC | PRN

## 2019-03-27 MED ORDER — ALBUTEROL SULFATE HFA 108 (90 BASE) MCG/ACT IN AERS: 2.0000 | INHALATION_SPRAY | Freq: Once | RESPIRATORY_TRACT | Status: DC | PRN

## 2019-03-27 MED ORDER — SODIUM CHLORIDE 0.9 % IV SOLN
700.0000 mg | Freq: Once | INTRAVENOUS | Status: AC
  Administered 2019-03-27: 700 mg via INTRAVENOUS
  Filled 2019-03-27: qty 20

## 2019-03-27 MED ORDER — EPINEPHRINE 0.3 MG/0.3ML IJ SOAJ: 0.3000 mg | Freq: Once | INTRAMUSCULAR | Status: DC | PRN

## 2019-03-27 MED ORDER — SODIUM CHLORIDE 0.9 % IV SOLN
INTRAVENOUS | Status: DC | PRN
  Administered 2019-03-27: 250 mL via INTRAVENOUS

## 2019-03-27 MED ORDER — FAMOTIDINE IN NACL 20-0.9 MG/50ML-% IV SOLN: 20.0000 mg | Freq: Once | INTRAVENOUS | Status: DC | PRN

## 2019-03-27 MED ORDER — METHYLPREDNISOLONE SODIUM SUCC 125 MG IJ SOLR: 125.0000 mg | Freq: Once | INTRAMUSCULAR | Status: DC | PRN

## 2019-03-27 NOTE — Progress Notes (Signed)
  Diagnosis: COVID-19  Physician:dr wright  Procedure: [facility] Infusion [facility] Med: bamlanivimab infusion - Provided patient with bamlanimivab fact sheet for patients, parents and caregivers prior to infusion.  Complications: No immediate complications noted.  Discharge: Discharged home   Maria Sanford [name] 03/27/2019

## 2019-03-27 NOTE — Discharge Instructions (Signed)

## 2021-04-17 IMAGING — MG DIGITAL SCREENING BILAT W/ TOMO W/ CAD
8 series · 8 of 24 positions shown · non-contrast
Comparison: Previous exam(s).

CLINICAL DATA: Screening.

EXAM:
DIGITAL SCREENING BILATERAL MAMMOGRAM WITH TOMO AND CAD

[L CC synth-2D]
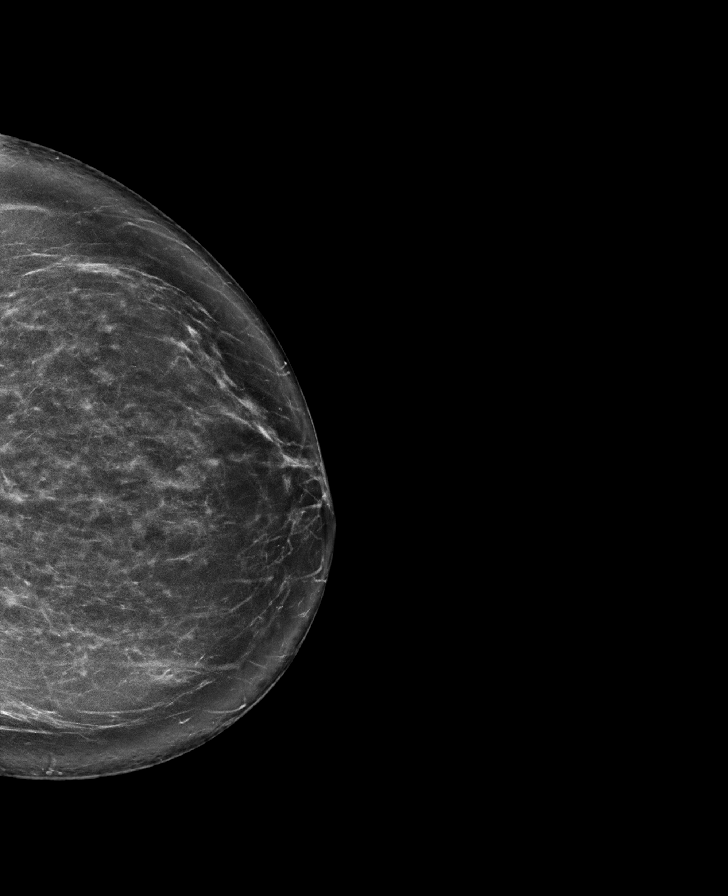

[R MLO synth-2D]
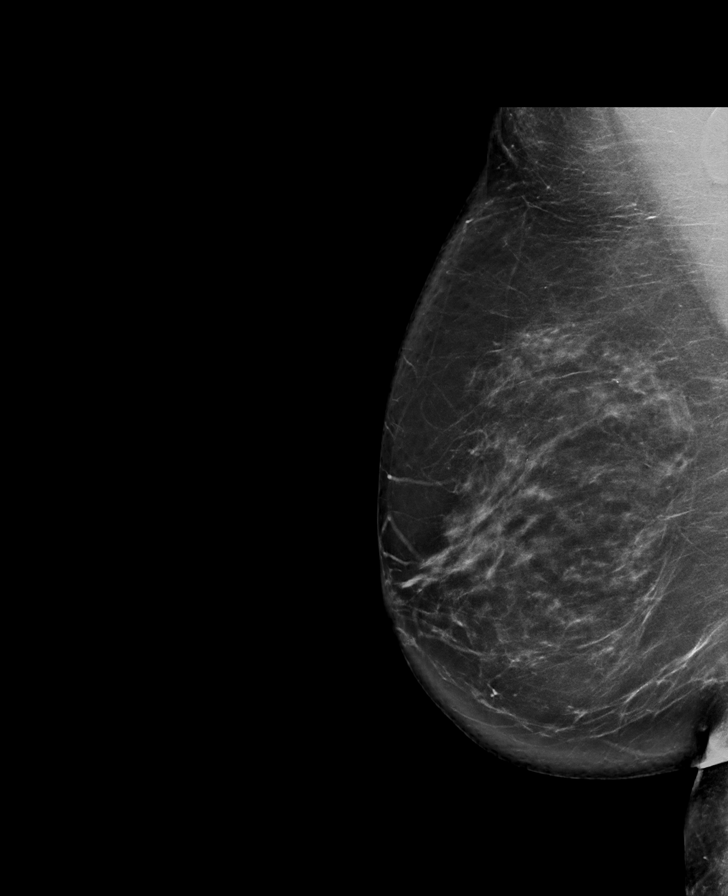

[L MLO synth-2D]
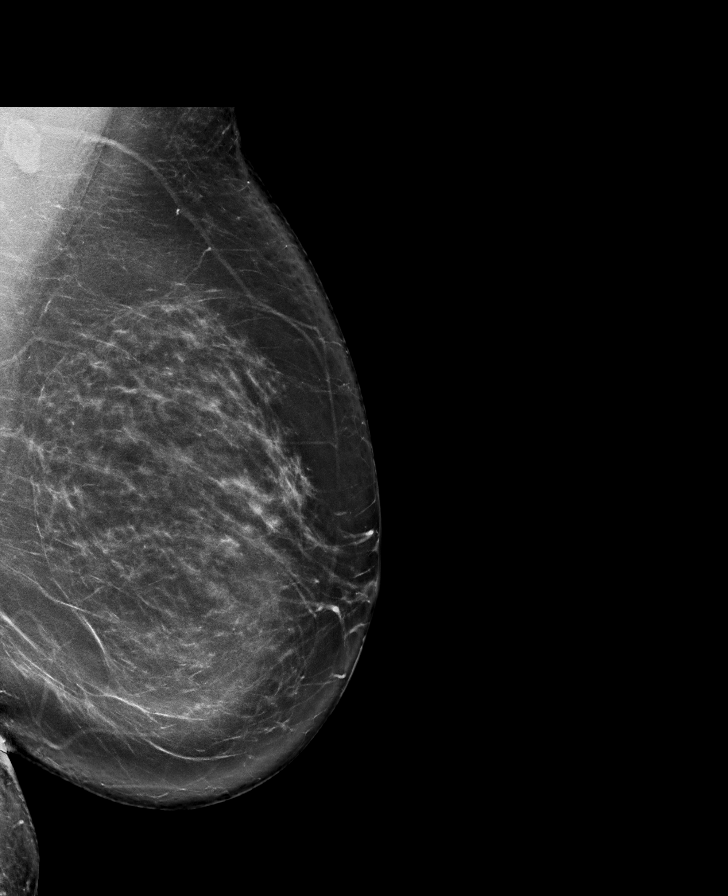

[R CC synth-2D]
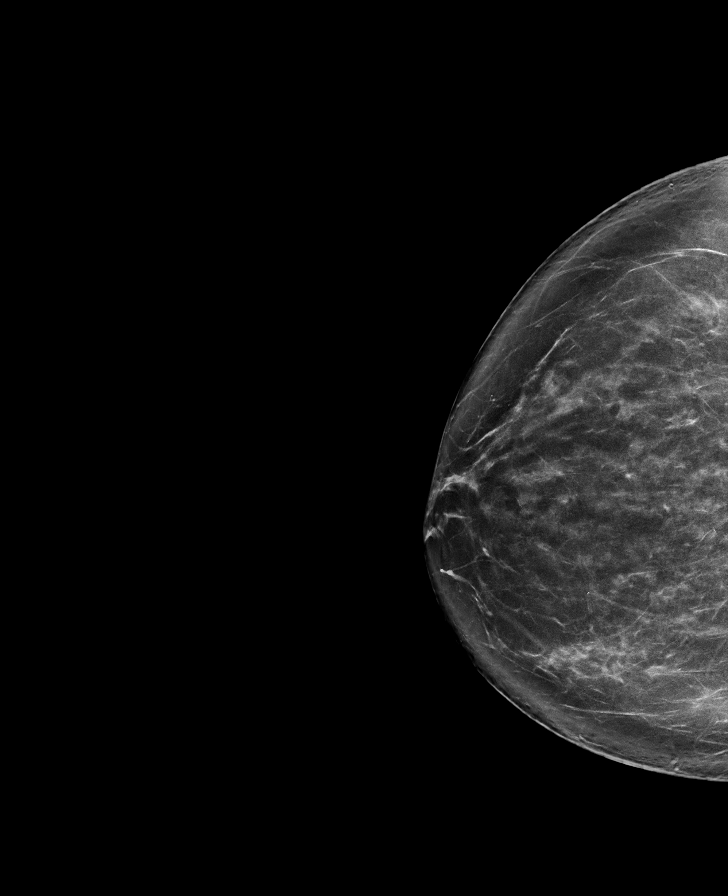

[R CC tomo · tomo slice 43/84.0]
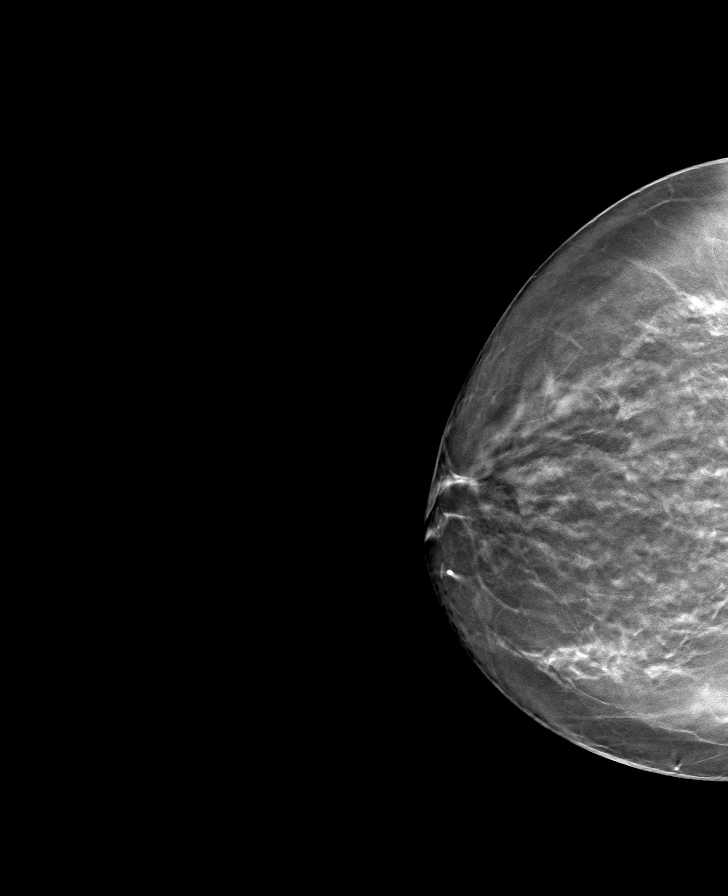

[R MLO tomo · tomo slice 46/91.0]
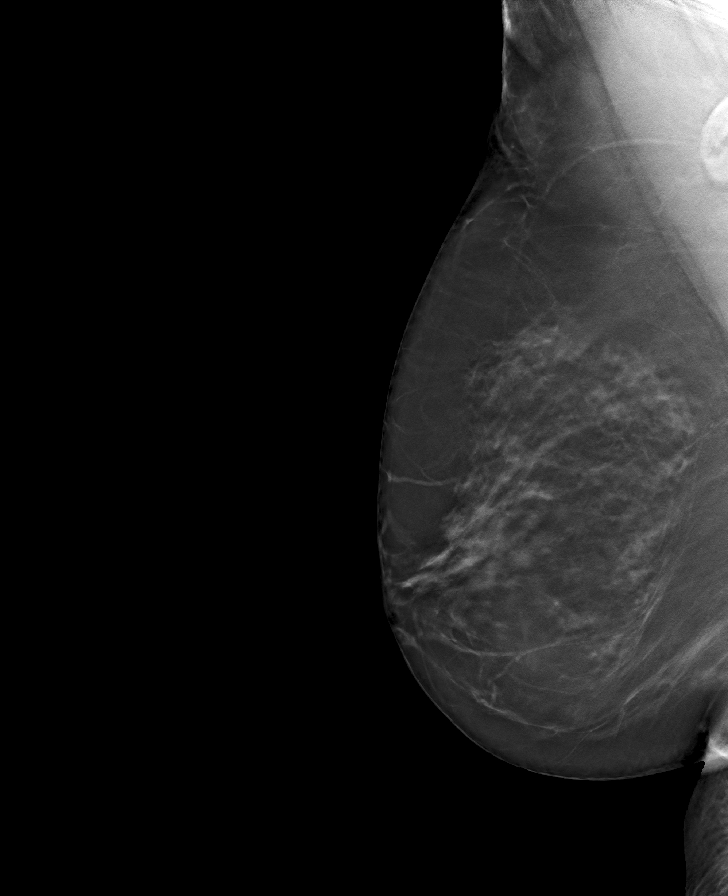

[L CC tomo · tomo slice 45/89.0]
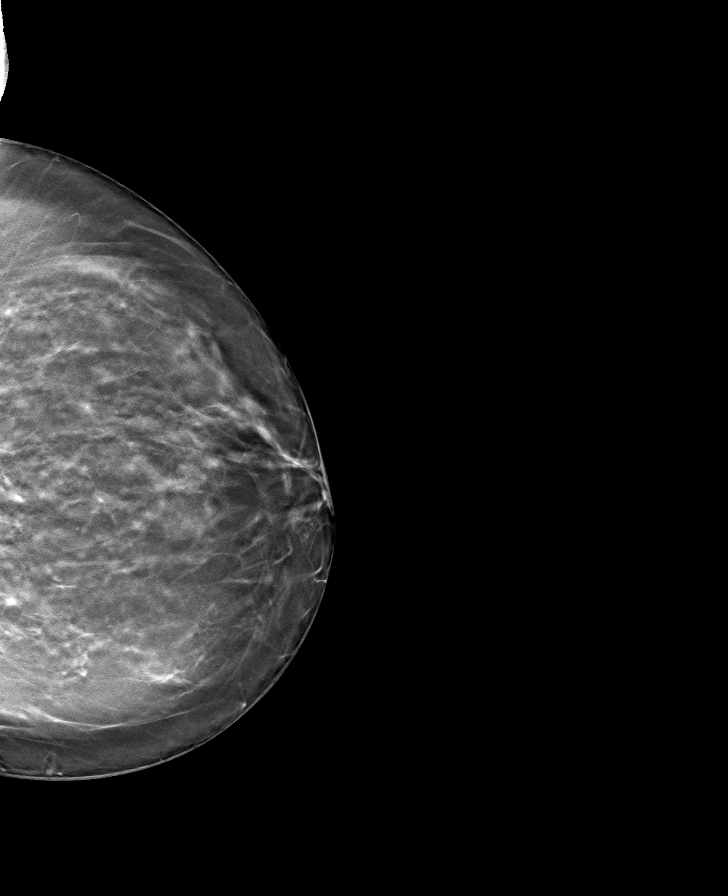

[L MLO tomo · tomo slice 47/93.0]
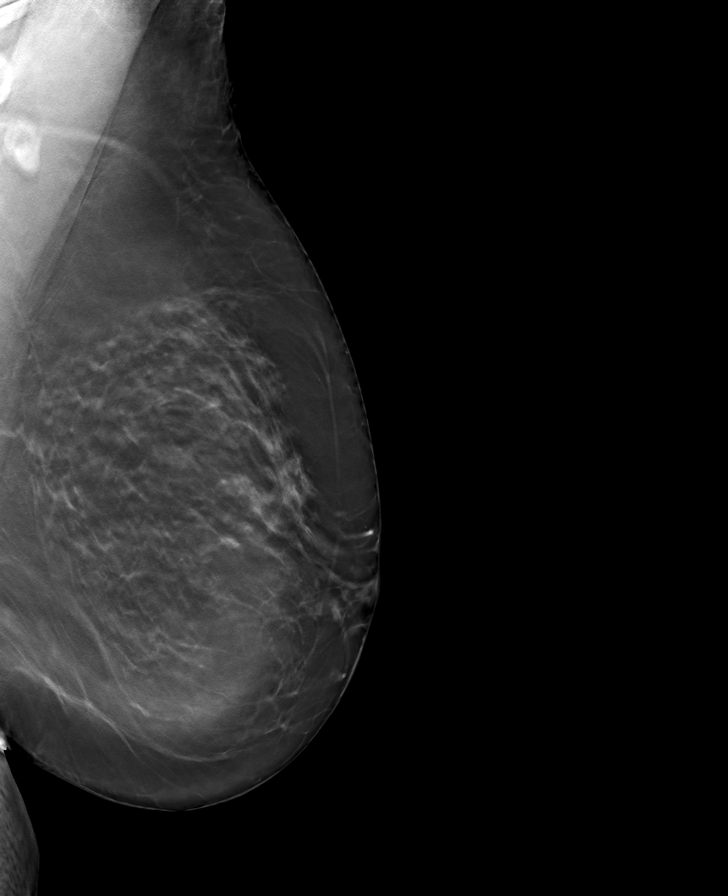

[8 of 24 positions shown; findings below may reference images not displayed]

ACR Breast Density Category b: There are scattered areas of
fibroglandular density.
FINDINGS: There are no findings suspicious for malignancy. Images were
processed with CAD.
IMPRESSION: No mammographic evidence of malignancy. A result letter of this
screening mammogram will be mailed directly to the patient.

RECOMMENDATION:
Screening mammogram in one year. (Code:[id number])

BI-RADS CATEGORY  1: Negative.

## 2023-01-29 ENCOUNTER — Ambulatory Visit: Payer: Self-pay

## 2023-06-24 ENCOUNTER — Ambulatory Visit: Payer: Self-pay
